# Patient Record
Sex: Female | Born: 1970 | Race: White | Hispanic: No | Marital: Married | Smoking: Never smoker
Health system: Southern US, Community
[De-identification: ages and names within clinical notes are randomized; demographics above are authoritative.]

---

## 2007-04-21 ENCOUNTER — Emergency Department (HOSPITAL_COMMUNITY): Admission: EM | Admit: 2007-04-21 | Discharge: 2007-04-21 | Payer: Self-pay | Admitting: Emergency Medicine

## 2009-05-10 HISTORY — PX: PARTIAL HYSTERECTOMY: SHX80

## 2011-02-15 LAB — CBC
HCT: 35.4 — ABNORMAL LOW
MCHC: 35.4
MCV: 88.4
Platelets: 196
RDW: 12.4
WBC: 7.7

## 2011-02-15 LAB — DIFFERENTIAL
Basophils Absolute: 0.1
Lymphs Abs: 1.1
Monocytes Relative: 7
Neutro Abs: 5.9
Neutrophils Relative %: 78 — ABNORMAL HIGH

## 2011-02-15 LAB — BASIC METABOLIC PANEL
BUN: 14
CO2: 24
Chloride: 108
Creatinine, Ser: 0.63
GFR calc non Af Amer: >60
Potassium: 3.4 — ABNORMAL LOW

## 2011-08-19 DIAGNOSIS — M6281 Muscle weakness (generalized): Secondary | ICD-10-CM | POA: Insufficient documentation

## 2011-08-19 DIAGNOSIS — T783XXA Angioneurotic edema, initial encounter: Secondary | ICD-10-CM | POA: Insufficient documentation

## 2011-08-19 DIAGNOSIS — T782XXA Anaphylactic shock, unspecified, initial encounter: Secondary | ICD-10-CM | POA: Insufficient documentation

## 2012-08-14 ENCOUNTER — Other Ambulatory Visit: Payer: Self-pay | Admitting: Obstetrics and Gynecology

## 2012-08-14 DIAGNOSIS — R928 Other abnormal and inconclusive findings on diagnostic imaging of breast: Secondary | ICD-10-CM

## 2012-08-25 ENCOUNTER — Ambulatory Visit
Admission: RE | Admit: 2012-08-25 | Discharge: 2012-08-25 | Disposition: A | Discharge status: Home / Self Care | Payer: BC Managed Care – PPO | Source: Ambulatory Visit | Attending: Obstetrics and Gynecology | Admitting: Obstetrics and Gynecology

## 2012-08-25 DIAGNOSIS — R928 Other abnormal and inconclusive findings on diagnostic imaging of breast: Secondary | ICD-10-CM

## 2012-09-11 ENCOUNTER — Other Ambulatory Visit: Payer: Self-pay | Admitting: Obstetrics and Gynecology

## 2012-09-11 DIAGNOSIS — N63 Unspecified lump in unspecified breast: Secondary | ICD-10-CM

## 2012-09-25 ENCOUNTER — Ambulatory Visit
Admission: RE | Admit: 2012-09-25 | Discharge: 2012-09-25 | Disposition: A | Discharge status: Home / Self Care | Payer: BC Managed Care – PPO | Source: Ambulatory Visit | Attending: Obstetrics and Gynecology | Admitting: Obstetrics and Gynecology

## 2012-09-25 ENCOUNTER — Other Ambulatory Visit: Payer: Self-pay | Admitting: Obstetrics and Gynecology

## 2012-09-25 DIAGNOSIS — N63 Unspecified lump in unspecified breast: Secondary | ICD-10-CM

## 2013-05-10 HISTORY — PX: CHOLECYSTECTOMY: SHX55

## 2013-08-03 IMAGING — US US LT BREAST BIOPSY
1 series · 8 of 8 positions shown · non-contrast
Comparison: Previous exams.

***ADDENDUM*** CREATED: 09/26/2012 [DATE]

Pathology revealed a fibroadenoma in the left breast. This was
found to be concordant by Dr. Natrah Afi. Pathology was relayed
to the patient by telephone. The patient reported doing well after
the biopsy. Post biopsy instructions were reviewed and her
questions were answered. She was encouraged to call [facility]
asked to return to [HOSPITAL] in 1 year for screening
mammography.
Pathology results are dictated by Soki Isaza RN, BSN on September 26, 2012.
***END ADDENDUM*** SIGNED BY: Neimara Plebani, M.D.
CLINICAL DATA: Mass in the left breast for biopsy.
ULTRASOUND GUIDED CORE BIOPSY OF THE left BREAST

[Series 2: us left breast biopsy · 8 of 8 slices shown]
[im 1/8]
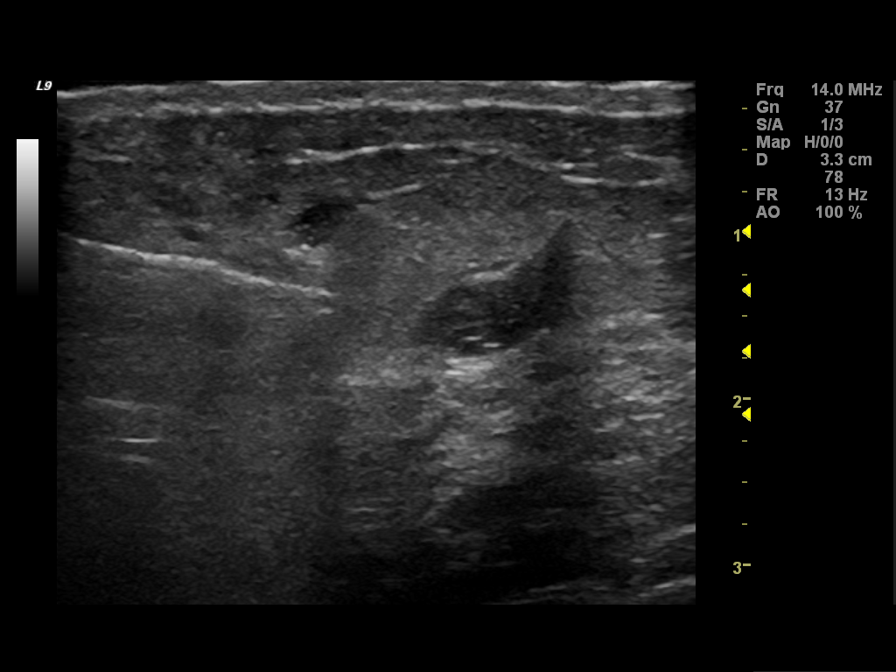
[im 2/8]
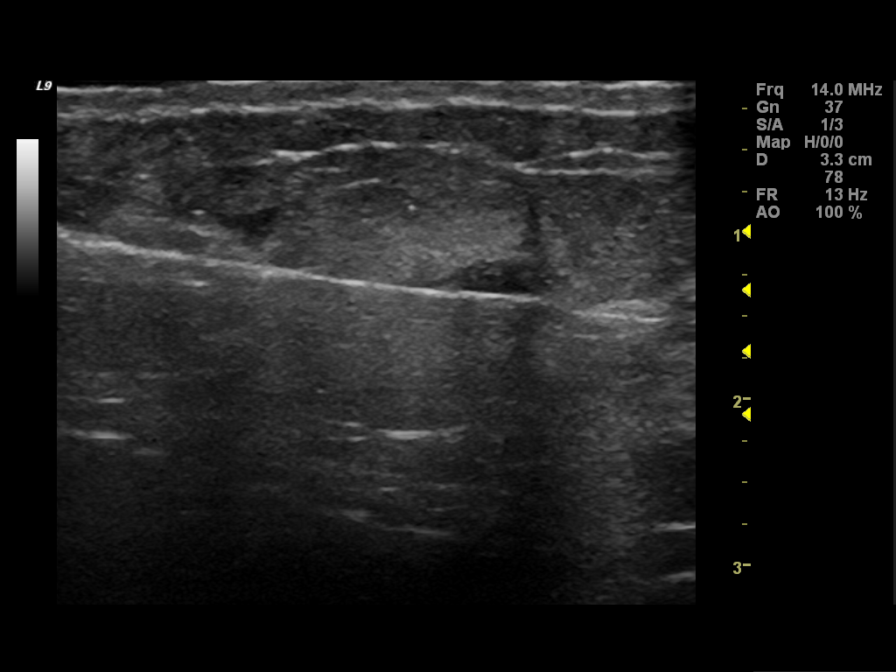
[im 3/8]
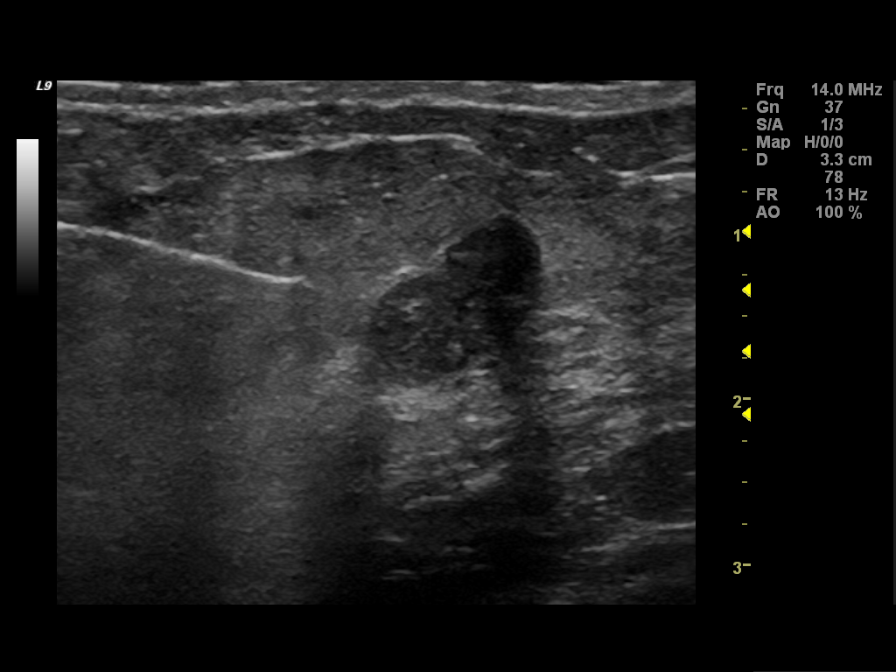
[im 4/8]
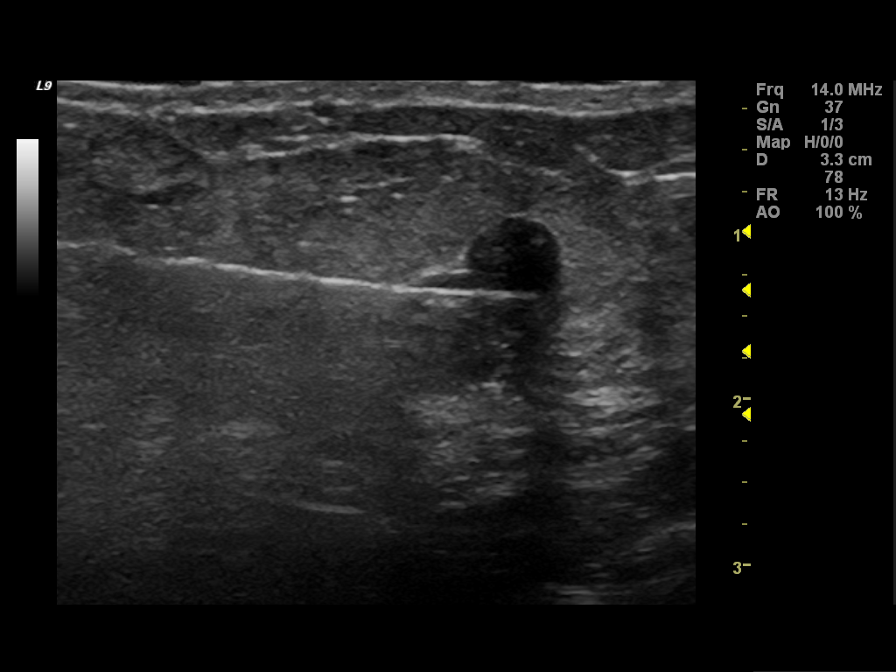
[im 5/8]
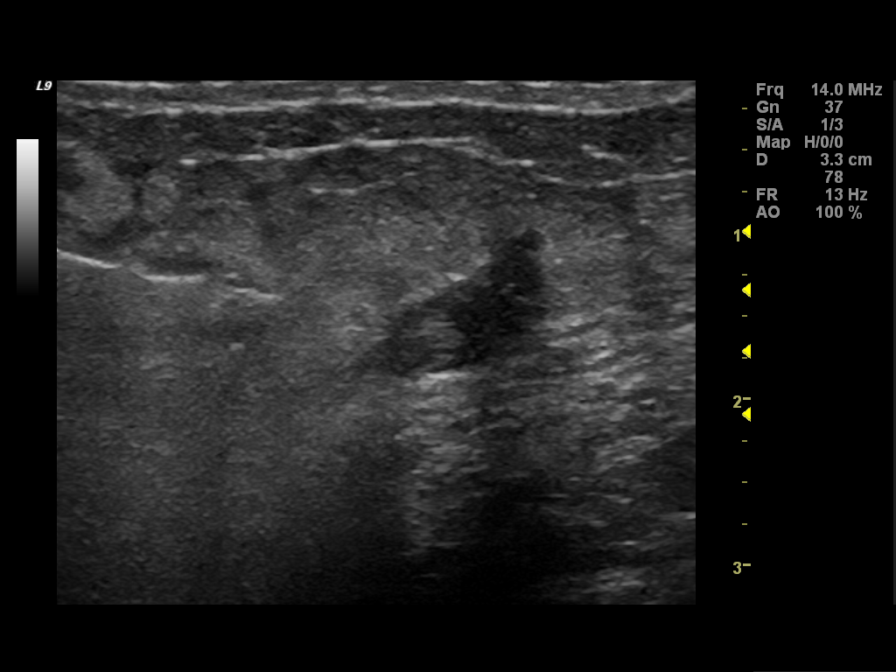
[im 6/8]
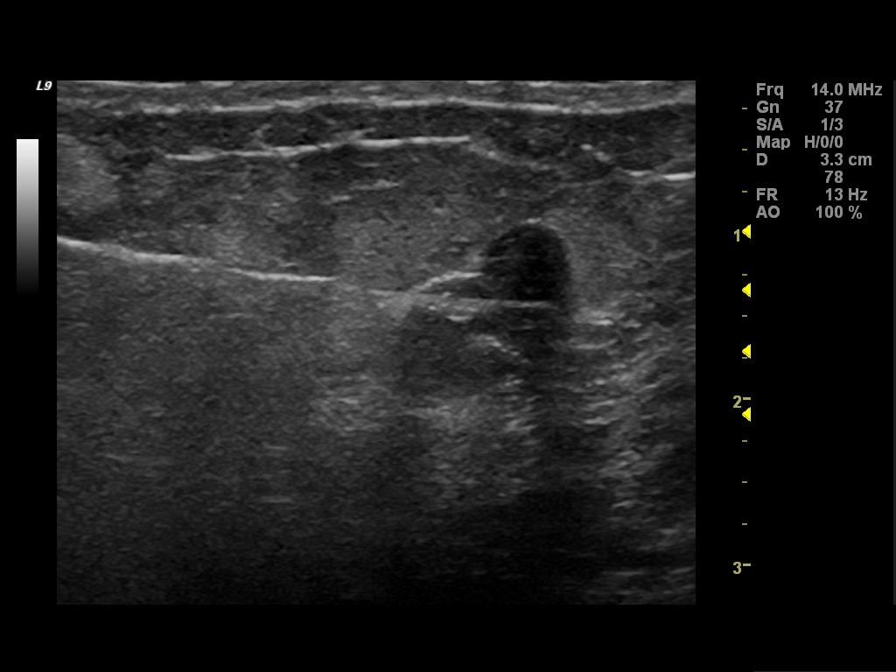
[im 7/8]
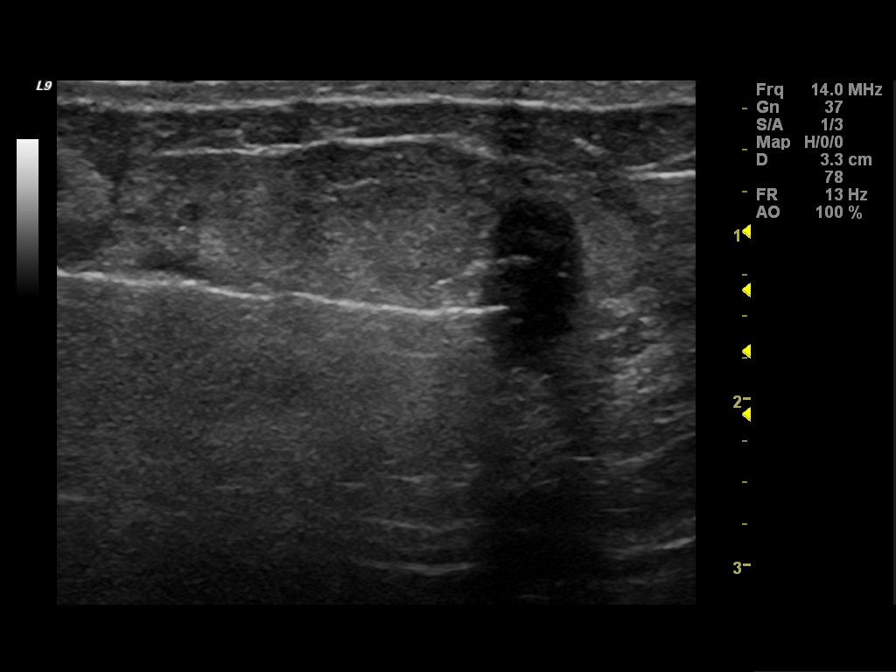
[im 8/8]
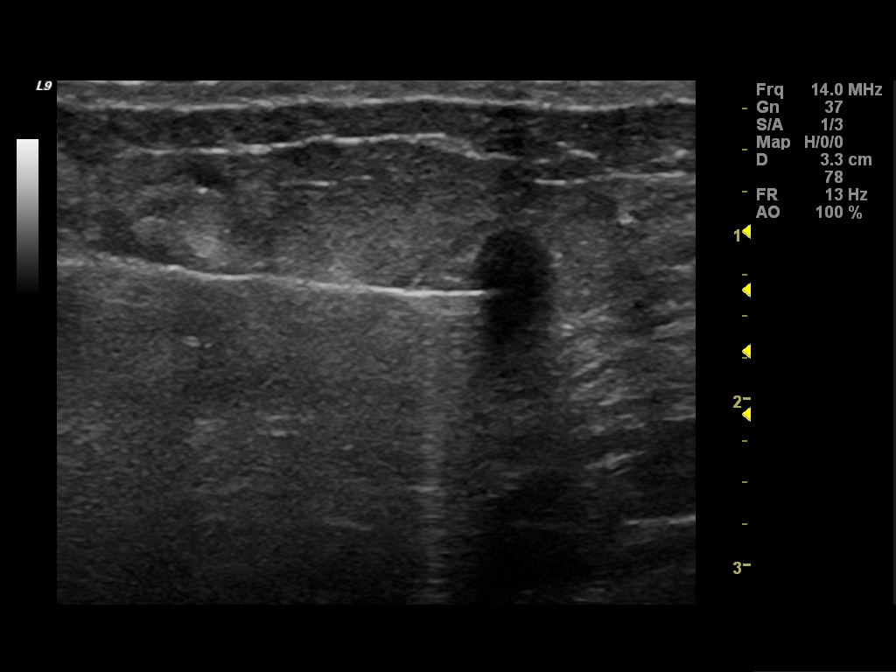

[8 of 8 positions shown; findings below may reference images not displayed]

I met with the patient and we discussed the procedure of ultrasound-
guided biopsy, including benefits and alternatives.  We discussed
the high likelihood of a successful procedure. We discussed the
risks of the procedure, including infection, bleeding, tissue
injury, clip migration, and inadequate sampling.  Informed written
consent was given.

Using sterile technique  lidocaine, ultrasound guidance and a 14
gauge automated biopsy device, biopsy was performed of oval
hypoechoic lesion the left breast two o'clock position using a
lateral approach.  At the conclusion of the procedure a  tissue
marker clip was deployed into the biopsy cavity.  Follow up 2 view
mammogram was performed and dictated separately.
IMPRESSION: Ultrasound guided biopsy of left breast.  No apparent
complications.

## 2013-08-03 IMAGING — MG MM DIGITAL DIAGNOSTIC UNILAT*L*
2 series · 2 of 2 positions shown · non-contrast
Comparison: Previous exams.

CLINICAL DATA: Status post ultrasound guided core biopsy left
breast mass

DIGITAL DIAGNOSTIC LEFT MAMMOGRAM

[L CC]
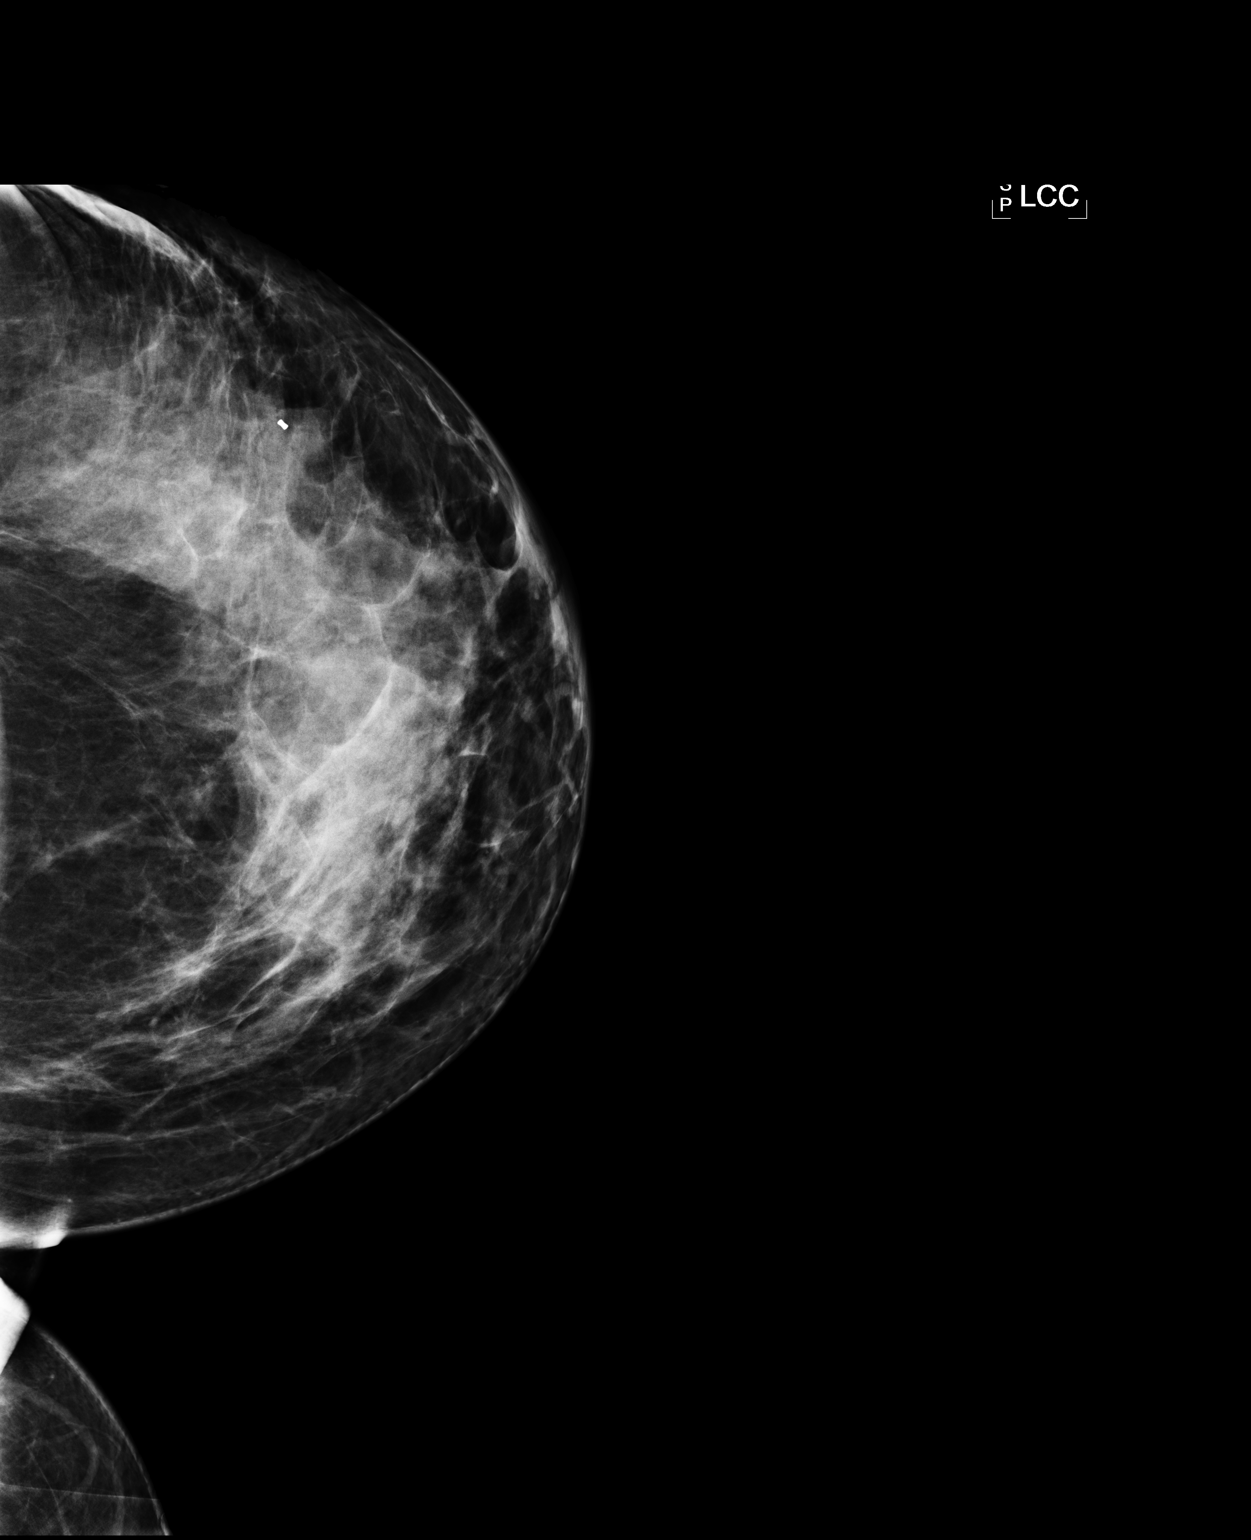

[L ML]
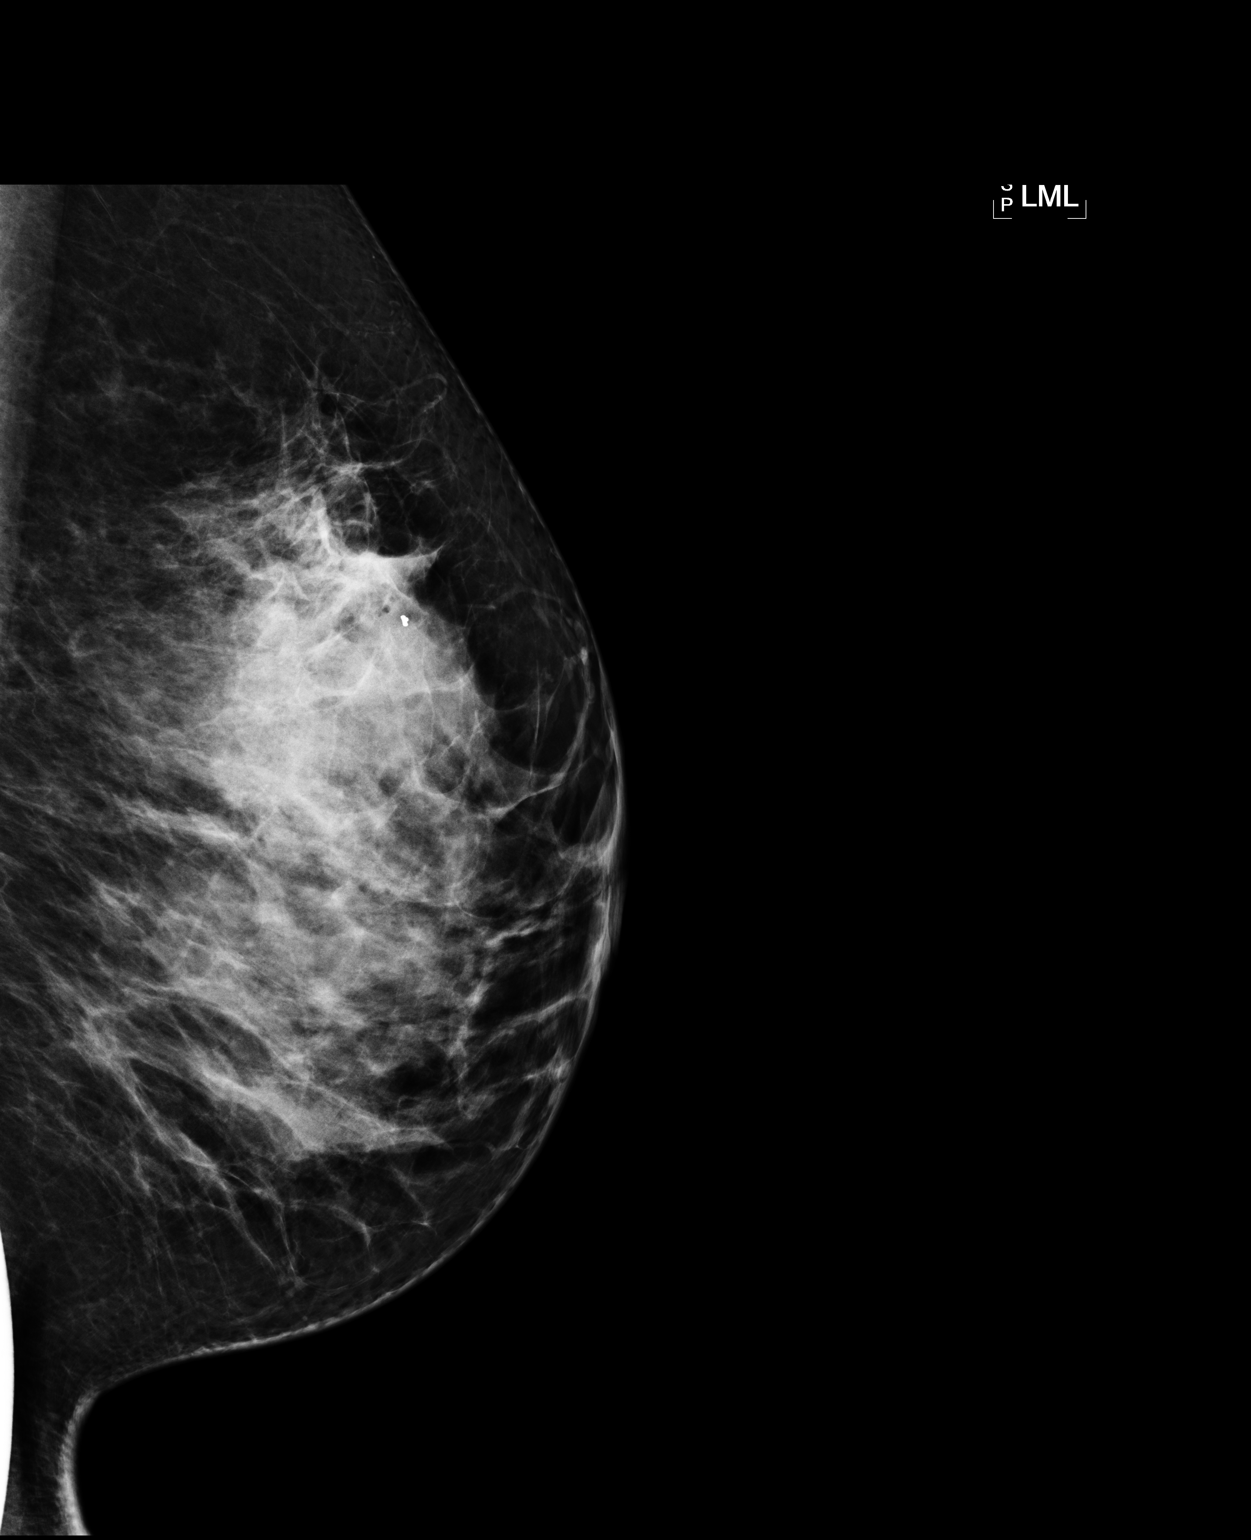

[2 of 2 positions shown; findings below may reference images not displayed]

FINDINGS: Films are performed following left breast ultrasound
guided biopsy of hypoechoic mass at 2 o'clock position.  CC and
lateral view of the left breast demonstrates biopsy clip in the
mass of concern.
IMPRESSION: Post biopsy mammogram demonstrating biopsy clip in the mass of
concern.

## 2015-07-04 DIAGNOSIS — B351 Tinea unguium: Secondary | ICD-10-CM | POA: Insufficient documentation

## 2015-07-04 DIAGNOSIS — G43009 Migraine without aura, not intractable, without status migrainosus: Secondary | ICD-10-CM | POA: Insufficient documentation

## 2015-07-04 DIAGNOSIS — Z79899 Other long term (current) drug therapy: Secondary | ICD-10-CM | POA: Insufficient documentation

## 2015-07-07 ENCOUNTER — Other Ambulatory Visit: Payer: Self-pay | Admitting: Specialist

## 2015-07-07 DIAGNOSIS — N644 Mastodynia: Secondary | ICD-10-CM

## 2015-07-07 DIAGNOSIS — N6452 Nipple discharge: Secondary | ICD-10-CM

## 2015-07-10 ENCOUNTER — Inpatient Hospital Stay: Admission: RE | Admit: 2015-07-10 | Payer: Self-pay | Source: Ambulatory Visit

## 2015-07-10 ENCOUNTER — Other Ambulatory Visit: Payer: Self-pay

## 2015-07-14 ENCOUNTER — Ambulatory Visit
Admission: RE | Admit: 2015-07-14 | Discharge: 2015-07-14 | Disposition: A | Discharge status: Home / Self Care | Payer: BLUE CROSS/BLUE SHIELD | Source: Ambulatory Visit | Attending: Specialist | Admitting: Specialist

## 2015-07-14 DIAGNOSIS — N644 Mastodynia: Secondary | ICD-10-CM

## 2015-07-14 DIAGNOSIS — N6452 Nipple discharge: Secondary | ICD-10-CM

## 2015-07-17 ENCOUNTER — Other Ambulatory Visit: Payer: Self-pay

## 2015-09-10 ENCOUNTER — Other Ambulatory Visit: Payer: Self-pay | Admitting: Obstetrics and Gynecology

## 2015-09-11 LAB — CYTOLOGY - PAP

## 2016-02-02 DIAGNOSIS — J310 Chronic rhinitis: Secondary | ICD-10-CM | POA: Insufficient documentation

## 2017-10-19 DIAGNOSIS — D1723 Benign lipomatous neoplasm of skin and subcutaneous tissue of right leg: Secondary | ICD-10-CM | POA: Insufficient documentation

## 2017-10-19 DIAGNOSIS — D1722 Benign lipomatous neoplasm of skin and subcutaneous tissue of left arm: Secondary | ICD-10-CM | POA: Insufficient documentation

## 2017-12-21 ENCOUNTER — Ambulatory Visit: Payer: BLUE CROSS/BLUE SHIELD | Admitting: Family

## 2018-01-10 ENCOUNTER — Ambulatory Visit: Payer: BLUE CROSS/BLUE SHIELD | Admitting: Internal Medicine

## 2018-01-17 ENCOUNTER — Encounter: Payer: Self-pay | Admitting: Internal Medicine

## 2018-01-17 ENCOUNTER — Ambulatory Visit (INDEPENDENT_AMBULATORY_CARE_PROVIDER_SITE_OTHER): Payer: BLUE CROSS/BLUE SHIELD | Admitting: Internal Medicine

## 2018-01-17 VITALS — BP 118/79 | HR 83 | Temp 98.6°F | Wt 210.0 lb

## 2018-01-17 DIAGNOSIS — A071 Giardiasis [lambliasis]: Secondary | ICD-10-CM

## 2018-01-17 NOTE — Progress Notes (Signed)
   RFV: new patient for giardiasis  Patient ID: Stephanie Stevens, female   DOB: Jul 01, 1970, 47 y.o.   MRN: [id number]  HPI Stephanie Stevens is a 47yo F who is otherwise in good health. 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 She reports that not ill for 66yr. But this past trip. Diarrhea started happening [place] Started to have watery diarrhea on 15th that persisted til she went to the ED on 7/21. Given cipro and flagyl without improvement. Had abdominalct that did not show any abn. cdiff negative. o and p negativeThen showed up again to the ED still feeling poorly on 7/26. GI pathogen panel that showed giardia  Med course : 10d of flagyl plus a dose of nitazoxanide 5039mbid (tinidazole 2gm single) x 2 , nausea. Treated for 2 courses of alinia. Since incomplete initially  #10 lb from diarrhea.   Still have sensitive stomach. And watery diarrhea- mostly in the morning, and after you eat. A small amount of abdominal cramping. Fluctuating.  eating activia/yogurt has helped  She doesn't feel like she has profuse diarrhea like she had in the past  She has questions to how to prevent reinfection in her next trip  No outpatient encounter medications on file as of 01/17/2018.   No facility-administered encounter medications on file as of 01/17/2018.      There are no active problems to display for this patient.    Health Maintenance Due  Topic Date Due  . HIV Screening  04/01/1986  . TETANUS/TDAP  04/01/1990  . INFLUENZA VACCINE  12/08/2017    Social History   Tobacco Use  . Smoking status: Never Smoker  . Smokeless tobacco: Never Used  Substance Use Topics  . Alcohol use: Yes  . Drug use: Never  family history is not on file. Review of Systems 12 point ros is negative except for hpi Physical Exam   BP 118/79   Pulse 83   Temp 98.6 F (37 C) (Oral)   Wt 210 lb (95.3 kg)   gen = a xo by 4 in nad Skin = warm, well  hydrated. No rash  CBC Lab Results  Component Value Date   WBC 7.7 04/21/2007   RBC 4.00 04/21/2007   HGB 12.5 04/21/2007   HCT 35.4 (L) 04/21/2007   PLT 196 04/21/2007   MCV 88.4 04/21/2007   MCHC 35.4 04/21/2007   RDW 12.4 04/21/2007   LYMPHSABS 1.1 04/21/2007   MONOABS 0.6 04/21/2007   EOSABS 0.0 (L) 04/21/2007    BMET Lab Results  Component Value Date   NA 139 04/21/2007   K 3.4 (L) 04/21/2007   CL 108 04/21/2007   CO2 24 04/21/2007   GLUCOSE 99 04/21/2007   BUN 14 04/21/2007   CREATININE 0.63 04/21/2007   CALCIUM 8.8 04/21/2007   GFRNONAA >60 04/21/2007   GFRAA  04/21/2007    >60        The eGFR has been calculated using the MDRD equation. 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 Will give her FODMAPS diet recommendations. Can use immodium if needed  For future trips = filtered water, hand hygiene. - filter, claypot filtration.  - [email or web address]

## 2018-04-05 DIAGNOSIS — Z87898 Personal history of other specified conditions: Secondary | ICD-10-CM | POA: Insufficient documentation

## 2019-01-10 DIAGNOSIS — R7303 Prediabetes: Secondary | ICD-10-CM | POA: Insufficient documentation

## 2019-01-10 DIAGNOSIS — E782 Mixed hyperlipidemia: Secondary | ICD-10-CM | POA: Insufficient documentation

## 2019-06-27 DIAGNOSIS — Z8616 Personal history of COVID-19: Secondary | ICD-10-CM | POA: Insufficient documentation

## 2019-08-29 DIAGNOSIS — G8929 Other chronic pain: Secondary | ICD-10-CM | POA: Insufficient documentation

## 2022-02-09 DIAGNOSIS — Z8679 Personal history of other diseases of the circulatory system: Secondary | ICD-10-CM | POA: Insufficient documentation

## 2022-02-23 DIAGNOSIS — R55 Syncope and collapse: Secondary | ICD-10-CM | POA: Insufficient documentation

## 2022-02-24 ENCOUNTER — Encounter: Payer: Self-pay | Admitting: Cardiology

## 2022-02-24 ENCOUNTER — Ambulatory Visit: Payer: BC Managed Care – PPO | Attending: Cardiology

## 2022-02-24 ENCOUNTER — Ambulatory Visit: Payer: BC Managed Care – PPO | Attending: Cardiology | Admitting: Cardiology

## 2022-02-24 VITALS — BP 126/74 | HR 76 | Ht 68.0 in | Wt 220.0 lb

## 2022-02-24 DIAGNOSIS — Z8679 Personal history of other diseases of the circulatory system: Secondary | ICD-10-CM

## 2022-02-24 DIAGNOSIS — R7303 Prediabetes: Secondary | ICD-10-CM | POA: Diagnosis not present

## 2022-02-24 DIAGNOSIS — E782 Mixed hyperlipidemia: Secondary | ICD-10-CM | POA: Diagnosis not present

## 2022-02-24 DIAGNOSIS — R55 Syncope and collapse: Secondary | ICD-10-CM | POA: Diagnosis not present

## 2022-02-24 NOTE — Progress Notes (Signed)
Cardiology Consultation:    Date:  02/24/2022   ID:  Zyon Grout, DOB [birth year], MRN [id number]  PCP:  Raina Mina., MD  Cardiologist:  Jenne Campus, MD   Referring MD: Raina Mina., MD   Chief Complaint  Patient presents with   Dizziness   Loss of Consciousness    09/    History of Present Illness:    Stephanie Stevens is a 51 y.o. female who is being seen today for the evaluation of syncope at the request of Raina Mina., MD. past medical history significant for mixed dyslipidemia, peripheral edema, prediabetes.  She said since September she had episode of near syncope and syncope.  First episode happened when she was in the morning she did not have her breakfast she did not drink anything she got up to go to the kitchen to cook some breakfast and then she became very dizzy started having tunnel vision she eventually end up sitting on the floor she said for few minutes started feeling better and was able to get up and up drinking and eating after that everything was fine.  She described another episode when she was in a [place] with her husband who is a ER physician she started feeling poorly apparently was pale no sweating no nausea no palpitations and eventually ended up falling down on the ground however her husband was able to hold her and eased her down to the ground, so there was no injury, there were no seizure activity, there was no incontinence of stool or urine.  Again she did not have any palpitation before and after.  She was giving some extra fluids started getting better and then was able to proceed with her day however she tells me that any sometimes she get those symptoms she will feel very poorly all day after that.  She described to have few more episodes much lighter than the one described above.  She complained of having some dizziness sometimes she did wear a monitor long time ago because she was discovered to have some PVCs.  I did review the  monitor is for from 2018, there were no worrisome arrhythmia.  She never injured herself.  She tells me however she does not like to look at the blood.  However, she never passed out while having blood taken and was seeing some bloody stain.  She does do a [place] that she have to perform some medical procedures and she has no difficulty doing it.  She does not exercise and is any structured program, she is not on any diet.  She sleeps well.  He started having some hot flashes.  History reviewed. No pertinent past medical history.  Past Surgical History:  Procedure Laterality Date   CHOLECYSTECTOMY  2015   PARTIAL HYSTERECTOMY  2011    Current Medications: Current Meds  Medication Sig   albuterol (VENTOLIN HFA) 108 (90 Base) MCG/ACT inhaler Inhale 1 puff into the lungs as needed for shortness of breath or wheezing.   aspirin EC (BAYER ASPIRIN EC LOW DOSE) 81 MG tablet Take 1 tablet by mouth daily.   EPINEPHrine 0.3 mg/0.3 mL IJ SOAJ injection Inject 0.3 mg into the muscle as needed for anaphylaxis.   estradiol (ESTRACE) 1 MG tablet Take 1 tablet by mouth daily.   famotidine (PEPCID) 20 MG tablet Take 1 tablet by mouth daily.   Fexofenadine HCl (ALLEGRA ALLERGY PO) Take 1 tablet by mouth  daily.   montelukast (SINGULAIR) 10 MG tablet Take 10 mg by mouth daily.   [DISCONTINUED] acyclovir (ZOVIRAX) 800 MG tablet Take 1 tablet by mouth as needed for other. Out breaks   [DISCONTINUED] almotriptan (AXERT) 12.5 MG tablet Take 1 tablet by mouth daily.   [DISCONTINUED] hydrochlorothiazide (HYDRODIURIL) 25 MG tablet Take 1 tablet by mouth daily as needed for fluid or edema.   [DISCONTINUED] ondansetron (ZOFRAN) 4 MG tablet Take 1 tablet by mouth 3 (three) times daily as needed for nausea/vomiting.   [DISCONTINUED] POTASSIUM CHLORIDE PO Take 1 tablet by mouth daily.   [DISCONTINUED] pregabalin (LYRICA) 50 MG capsule Take 1 capsule by mouth in the morning and  at bedtime.     Allergies:   Other, Sulfa antibiotics, Amoxicillin, Bee venom, Cephalexin, Peanut-containing drug products, Penicillins, and Shellfish allergy   Social History   Socioeconomic History   Marital status: Married    Spouse name: Not on file   Number of children: Not on file   Years of education: Not on file   Highest education level: Not on file  Occupational History   Not on file  Tobacco Use   Smoking status: Never   Smokeless tobacco: Never  Substance and Sexual Activity   Alcohol use: Yes   Drug use: Never   Sexual activity: Yes  Other Topics Concern   Not on file  Social History Narrative   Not on file   Social Determinants of Health   Financial Resource Strain: Not on file  Food Insecurity: Not on file  Transportation Needs: Not on file  Physical Activity: Not on file  Stress: Not on file  Social Connections: Not on file     Family History: The patient's family history includes Diabetes in her father; Hypertension in her father and mother; Stroke in her mother. ROS:   Please see the history of present illness.    All 14 point review of systems negative except as described per history of present illness.  EKGs/Labs/Other Studies Reviewed:    The following studies were reviewed today: I did review monitor from 2018 showing some PVCs  EKG:  EKG is  ordered today.  The ekg ordered today demonstrates normal sinus rhythm, PVCs, nonspecific ST segment changes  Recent Labs: No results found for requested labs within last 365 days.  Recent Lipid Panel No results found for: "CHOL", "TRIG", "HDL", "CHOLHDL", "VLDL", "LDLCALC", "LDLDIRECT"  Physical Exam:    VS:  BP 126/74 (BP Location: Left Arm, Patient Position: Sitting)   Pulse 76   Ht '5\' 8"'$  (1.727 m)   Wt 220 lb (99.8 kg)   SpO2 96%   BMI 33.45 kg/m     Wt Readings from Last 3 Encounters:  02/24/22 220 lb (99.8 kg)  01/17/18 210 lb (95.3 kg)     GEN:  Well nourished, well developed in  no acute distress HEENT: Normal NECK: No JVD; No carotid bruits LYMPHATICS: No lymphadenopathy CARDIAC: RRR, no murmurs, no rubs, no gallops RESPIRATORY:  Clear to auscultation without rales, wheezing or rhonchi  ABDOMEN: Soft, non-tender, non-distended MUSCULOSKELETAL:  No edema; No deformity  SKIN: Warm and dry NEUROLOGIC:  Alert and oriented x 3 PSYCHIATRIC:  Normal affect   ASSESSMENT:    1. Syncope, unspecified syncope type   2. Prediabetes   3. History of mitral valve prolapse   4. Mixed hyperlipidemia    PLAN:    In order of problems listed above:  Syncope.  From description she gave me look  like either orthostatic hypotension vasovagal.  We did check orthostatic hypotension today and she did not have any.  On the right arm laying down was 124/84 sitting up was 124/84 standing up 138/90.  Left arm similar numbers.  We did talk the basic of vasovagal syncope.  I asked I encouraged her to be well-hydrated she tells me already that she is trying to do that and she is feeling better.  I will ask her however to her monitor make sure she does not have any significant arrhythmia.  Or make me worry is her PVCs that she have quite a bit just on my EKG and also during the physical exam I heard some irregularities.  So, we need to determine the burden of this PVCs.  She also will have echocardiogram done to check left ventricle ejection fraction.  She does not have any indication for potential antiarrhythmic etiology of this phenomenon however frequent PVCs make me concerned.  She does not have any family history of sudden cardiac death however, apparently some of the family member had stroke but not at early age. Prediabetes, that being followed by internal medicine team.  She is stable. History of mitral valve prolapse.  We will get echocardiogram to assess left ventricle ejection fraction Dyslipidemia, will make arrangements for fasting lipid profile to be checked    Medication  Adjustments/Labs and Tests Ordered: Current medicines are reviewed at length with the patient today.  Concerns regarding medicines are outlined above.  Orders Placed This Encounter  Procedures   LONG TERM MONITOR (3-14 DAYS)   ECHOCARDIOGRAM COMPLETE   No orders of the defined types were placed in this encounter.   Signed, Park Liter, MD, [facility]. 02/24/2022 12:09 PM    [place]

## 2022-02-24 NOTE — Patient Instructions (Signed)
Medication Instructions:  Your physician recommends that you continue on your current medications as directed. Please refer to the Current Medication list given to you today.  *If you need a refill on your cardiac medications before your next appointment, please call your pharmacy*   Lab Work: None If you have labs (blood work) drawn today and your tests are completely normal, you will receive your results only by: [place] (if you have MyChart) OR A paper copy in the mail If you have any lab test that is abnormal or we need to change your treatment, we will call you to review the results.   Testing/Procedures: [place]- Long Term Monitor Instructions  Your physician has requested you wear a ZIO patch monitor for 14 days.  This is a single patch monitor. [employer] supplies one patch monitor per enrollment. Additional stickers are not available. Please do not apply patch if you will be having a Nuclear Stress Test,  Echocardiogram, Cardiac CT, MRI, or Chest Xray during the period you would be wearing the  monitor. The patch cannot be worn during these tests. You cannot remove and re-apply the  ZIO XT patch monitor.  Your ZIO patch monitor will be mailed 3 day USPS to your address on file. It may take 3-5 days  to receive your monitor after you have been enrolled.  Once you have received your monitor, please review the enclosed instructions. Your monitor  has already been registered assigning a specific monitor serial # to you.    Your physician has requested that you have an echocardiogram. Echocardiography is a painless test that uses sound waves to create images of your heart. It provides your doctor with information about the size and shape of your heart and how well your heart's chambers and valves are working. This procedure takes approximately one hour. There are no restrictions for this procedure. Please do NOT wear cologne, perfume, aftershave, or lotions (deodorant is  allowed). Please arrive 15 minutes prior to your appointment time.   We recommend signing up for the patient portal called "MyChart".  Sign up information is provided on this After Visit Summary.  MyChart is used to connect with patients for Virtual Visits (Telemedicine).  Patients are able to view lab/test results, encounter notes, upcoming appointments, etc.  Non-urgent messages can be sent to your provider as well.   To learn more about what you can do with MyChart, go to [email or web address].    Your next appointment:   6 week(s)  The format for your next appointment:   In Person  Provider:   Jenne Campus, MD    Other Instructions   Important Information About Sugar

## 2022-02-24 NOTE — Addendum Note (Signed)
Addended by: Darius Bump B on: 02/24/2022 02:42 PM   Modules accepted: Orders

## 2022-03-02 ENCOUNTER — Ambulatory Visit: Payer: BLUE CROSS/BLUE SHIELD | Admitting: Cardiology

## 2022-03-05 ENCOUNTER — Ambulatory Visit: Payer: BC Managed Care – PPO | Attending: Cardiology

## 2022-03-05 DIAGNOSIS — R55 Syncope and collapse: Secondary | ICD-10-CM | POA: Diagnosis not present

## 2022-03-05 LAB — ECHOCARDIOGRAM COMPLETE
Area-P 1/2: 3.42 cm2
P 1/2 time: 766 msec
S' Lateral: 3.7 cm

## 2022-04-07 ENCOUNTER — Encounter: Payer: Self-pay | Admitting: Cardiology

## 2022-04-07 ENCOUNTER — Ambulatory Visit: Payer: BC Managed Care – PPO | Attending: Cardiology | Admitting: Cardiology

## 2022-04-07 VITALS — BP 130/82 | HR 82 | Ht 68.0 in | Wt 218.8 lb

## 2022-04-07 DIAGNOSIS — Z8679 Personal history of other diseases of the circulatory system: Secondary | ICD-10-CM | POA: Diagnosis not present

## 2022-04-07 DIAGNOSIS — R55 Syncope and collapse: Secondary | ICD-10-CM

## 2022-04-07 DIAGNOSIS — E782 Mixed hyperlipidemia: Secondary | ICD-10-CM | POA: Diagnosis not present

## 2022-04-07 NOTE — Progress Notes (Signed)
Cardiology Office Note:    Date:  04/07/2022   ID:  Stephanie Stevens, DOB [birth year], MRN [id number]  PCP:  Raina Mina., MD  Cardiologist:  Jenne Campus, MD    Referring MD: Raina Mina., MD   Chief Complaint  Patient presents with   Follow-up  Doing well  History of Present Illness:    Stephanie Stevens is a 51 y.o. female with past medical history significant for mixed dyslipidemia, peripheral edema, prediabetes, she was referred to [place] because of episode of near syncope and syncope, from the description look like it was vagal versus orthostatic.  For completion we did monitor which showed some APCs however otherwise no worrisome arrhythmia, she also had echocardiogram done which showed normal left ventricle ejection fraction without significant valvular pathology. She is coming today 2 months for follow-up she made few changes in her life and she is feeling better.  She stopped drinking coffee she is increased overall fluid intake she wears elastic stockings since that time there is no more passing out.  She exercise on the regular basis but admits that she needed to elevate more.  She uses stationary bike she is planning to go to [place] again I warned her that she needs to stay well-hydrated when she is there.  History reviewed. No pertinent past medical history.  Past Surgical History:  Procedure Laterality Date   CHOLECYSTECTOMY  2015   PARTIAL HYSTERECTOMY  2011    Current Medications: Current Meds  Medication Sig   albuterol (VENTOLIN HFA) 108 (90 Base) MCG/ACT inhaler Inhale 1 puff into the lungs as needed for shortness of breath or wheezing.   aspirin EC (BAYER ASPIRIN EC LOW DOSE) 81 MG tablet Take 1 tablet by mouth daily.   EPINEPHrine 0.3 mg/0.3 mL IJ SOAJ injection Inject 0.3 mg into the muscle as needed for anaphylaxis.   estradiol (ESTRACE) 1 MG tablet Take 1 tablet by mouth daily.   famotidine (PEPCID) 20 MG tablet Take 1 tablet by mouth daily.    Fexofenadine HCl (ALLEGRA ALLERGY PO) Take 1 tablet by mouth daily.   montelukast (SINGULAIR) 10 MG tablet Take 10 mg by mouth daily.     Allergies:   Other, Sulfa antibiotics, Amoxicillin, Bee venom, Cephalexin, Peanut-containing drug products, Penicillins, and Shellfish allergy   Social History   Socioeconomic History   Marital status: Married    Spouse name: Not on file   Number of children: Not on file   Years of education: Not on file   Highest education level: Not on file  Occupational History   Not on file  Tobacco Use   Smoking status: Never   Smokeless tobacco: Never  Substance and Sexual Activity   Alcohol use: Yes   Drug use: Never   Sexual activity: Yes  Other Topics Concern   Not on file  Social History Narrative   Not on file   Social Determinants of Health   Financial Resource Strain: Not on file  Food Insecurity: Not on file  Transportation Needs: Not on file  Physical Activity: Not on file  Stress: Not on file  Social Connections: Not on file     Family History: The patient's family history includes Diabetes in her father; Hypertension in her father and mother; Stroke in her mother. ROS:   Please see the history of present illness.    All 14 point review of systems negative except as described per history of present illness  EKGs/Labs/Other Studies Reviewed:  Recent Labs: No results found for requested labs within last 365 days.  Recent Lipid Panel No results found for: "CHOL", "TRIG", "HDL", "CHOLHDL", "VLDL", "LDLCALC", "LDLDIRECT"  Physical Exam:    VS:  BP 130/82 (BP Location: Left Arm, Patient Position: Sitting)   Pulse 82   Ht '5\' 8"'$  (1.727 m)   Wt 218 lb 12.8 oz (99.2 kg)   SpO2 98%   BMI 33.27 kg/m     Wt Readings from Last 3 Encounters:  04/07/22 218 lb 12.8 oz (99.2 kg)  02/24/22 220 lb (99.8 kg)  01/17/18 210 lb (95.3 kg)     GEN:  Well nourished, well developed in no acute distress HEENT: Normal NECK: No JVD; No  carotid bruits LYMPHATICS: No lymphadenopathy CARDIAC: RRR, no murmurs, no rubs, no gallops RESPIRATORY:  Clear to auscultation without rales, wheezing or rhonchi  ABDOMEN: Soft, non-tender, non-distended MUSCULOSKELETAL:  No edema; No deformity  SKIN: Warm and dry LOWER EXTREMITIES: no swelling NEUROLOGIC:  Alert and oriented x 3 PSYCHIATRIC:  Normal affect   ASSESSMENT:    1. Vasovagal syncope   2. Mixed hyperlipidemia   3. History of mitral valve prolapse    PLAN:    In order of problems listed above:  Vasovagal syncope.  We discussed maintenance again.  We discussed preventative measures she stopped drinking coffee, she is drinking decaffeinated tea, she is drinking plenty of fluids she is wearing elastic stockings all of those measures seems to be helping. Mixed dyslipidemia, I do not have any recent fasting lipid profile we will make arrangements for this during following visit r History of mitral valve prolapse but echocardiogram did not show evidence of this on the last study   Medication Adjustments/Labs and Tests Ordered: Current medicines are reviewed at length with the patient today.  Concerns regarding medicines are outlined above.  No orders of the defined types were placed in this encounter.  Medication changes: No orders of the defined types were placed in this encounter.   Signed, Park Liter, MD, [facility] 04/07/2022 11:22 AM    [place]

## 2022-04-07 NOTE — Patient Instructions (Signed)

## 2023-02-22 ENCOUNTER — Ambulatory Visit (INDEPENDENT_AMBULATORY_CARE_PROVIDER_SITE_OTHER): Payer: Commercial Managed Care - PPO | Admitting: Psychiatry

## 2023-02-22 DIAGNOSIS — Z630 Problems in relationship with spouse or partner: Secondary | ICD-10-CM | POA: Diagnosis not present

## 2023-02-22 DIAGNOSIS — F4323 Adjustment disorder with mixed anxiety and depressed mood: Secondary | ICD-10-CM
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
## 2023-03-18 ENCOUNTER — Ambulatory Visit (INDEPENDENT_AMBULATORY_CARE_PROVIDER_SITE_OTHER): Payer: Commercial Managed Care - PPO | Admitting: Psychiatry

## 2023-03-18 DIAGNOSIS — Z62810 Personal history of physical and sexual abuse in childhood: Secondary | ICD-10-CM | POA: Diagnosis not present

## 2023-03-18 DIAGNOSIS — Z630 Problems in relationship with spouse or partner: Secondary | ICD-10-CM

## 2023-03-18 DIAGNOSIS — F4323 Adjustment disorder with mixed anxiety and depressed mood: Secondary | ICD-10-CM
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
## 2023-03-29 ENCOUNTER — Ambulatory Visit: Payer: Self-pay | Admitting: Cardiology

## 2023-04-04 ENCOUNTER — Ambulatory Visit (INDEPENDENT_AMBULATORY_CARE_PROVIDER_SITE_OTHER): Payer: Commercial Managed Care - PPO | Admitting: Psychiatry

## 2023-04-04 DIAGNOSIS — Z630 Problems in relationship with spouse or partner: Secondary | ICD-10-CM | POA: Diagnosis not present

## 2023-04-04 DIAGNOSIS — F4323 Adjustment disorder with mixed anxiety and depressed mood: Secondary | ICD-10-CM
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
## 2023-04-11 ENCOUNTER — Ambulatory Visit (INDEPENDENT_AMBULATORY_CARE_PROVIDER_SITE_OTHER): Payer: Commercial Managed Care - PPO | Admitting: Psychiatry

## 2023-04-11 DIAGNOSIS — Z630 Problems in relationship with spouse or partner: Secondary | ICD-10-CM | POA: Diagnosis not present

## 2023-04-11 DIAGNOSIS — F4323 Adjustment disorder with mixed anxiety and depressed mood: Secondary | ICD-10-CM
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
## 2023-04-28 ENCOUNTER — Ambulatory Visit: Payer: Commercial Managed Care - PPO | Admitting: Psychiatry

## 2023-05-12 ENCOUNTER — Ambulatory Visit: Payer: Commercial Managed Care - PPO | Admitting: Psychiatry

## 2023-05-19 ENCOUNTER — Ambulatory Visit: Payer: Commercial Managed Care - PPO | Admitting: Psychiatry

## 2023-05-26 ENCOUNTER — Ambulatory Visit: Payer: Commercial Managed Care - PPO | Admitting: Psychiatry

## 2023-06-02 ENCOUNTER — Ambulatory Visit: Payer: Commercial Managed Care - PPO | Admitting: Psychiatry

## 2023-06-09 ENCOUNTER — Ambulatory Visit: Payer: Commercial Managed Care - PPO | Admitting: Psychiatry

## 2023-06-16 ENCOUNTER — Ambulatory Visit: Payer: Commercial Managed Care - PPO | Admitting: Psychiatry
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
